# Patient Record
Sex: Male | Born: 1990 | Race: White | Hispanic: Yes | Marital: Single | Smoking: Current every day smoker
Health system: Southern US, Community
[De-identification: ages and names within clinical notes are randomized; demographics above are authoritative.]

## PROBLEM LIST (undated history)

## (undated) DIAGNOSIS — F989 Unspecified behavioral and emotional disorders with onset usually occurring in childhood and adolescence: Secondary | ICD-10-CM

## (undated) DIAGNOSIS — F419 Anxiety disorder, unspecified: Secondary | ICD-10-CM

---
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
## 2009-04-30 ENCOUNTER — Emergency Department (HOSPITAL_COMMUNITY): Admission: EM | Admit: 2009-04-30 | Discharge: 2009-05-01 | Payer: Self-pay | Admitting: Emergency Medicine

## 2010-04-27 ENCOUNTER — Emergency Department (HOSPITAL_COMMUNITY): Admission: EM | Admit: 2010-04-27 | Discharge: 2010-04-27 | Payer: Self-pay | Admitting: Emergency Medicine

## 2010-09-29 ENCOUNTER — Emergency Department (HOSPITAL_COMMUNITY)
Admission: EM | Admit: 2010-09-29 | Discharge: 2010-09-29 | Payer: Self-pay | Source: Home / Self Care | Admitting: Family Medicine

## 2010-11-13 LAB — RPR: RPR Ser Ql: NONREACTIVE

## 2010-12-04 LAB — DIFFERENTIAL
Basophils Absolute: 0.1 10*3/uL (ref 0.0–0.1)
Eosinophils Absolute: 0.3 10*3/uL (ref 0.0–0.7)
Lymphocytes Relative: 25 % (ref 12–46)
Monocytes Absolute: 0.8 10*3/uL (ref 0.1–1.0)
Neutro Abs: 5.6 10*3/uL (ref 1.7–7.7)

## 2010-12-04 LAB — COMPREHENSIVE METABOLIC PANEL
ALT: 19 U/L (ref 0–53)
AST: 22 U/L (ref 0–37)
Albumin: 4.2 g/dL (ref 3.5–5.2)
BUN: 12 mg/dL (ref 6–23)
CO2: 24 mEq/L (ref 19–32)
Calcium: 8.9 mg/dL (ref 8.4–10.5)
GFR calc Af Amer: >60 mL/min (ref >60)
Glucose, Bld: 99 mg/dL (ref 70–99)
Sodium: 140 mEq/L (ref 135–145)

## 2010-12-04 LAB — RAPID URINE DRUG SCREEN, HOSP PERFORMED
Amphetamines: NOT DETECTED
Barbiturates: NOT DETECTED
Benzodiazepines: NOT DETECTED
Cocaine: NOT DETECTED
Opiates: NOT DETECTED

## 2010-12-04 LAB — URINALYSIS, ROUTINE W REFLEX MICROSCOPIC
Bilirubin Urine: NEGATIVE
Glucose, UA: NEGATIVE mg/dL
Hgb urine dipstick: NEGATIVE
Protein, ur: NEGATIVE mg/dL

## 2010-12-04 LAB — CBC
Hemoglobin: 15.9 g/dL (ref 13.0–17.0)
RBC: 4.64 MIL/uL (ref 4.22–5.81)
WBC: 9.1 10*3/uL (ref 4.0–10.5)

## 2011-02-09 ENCOUNTER — Inpatient Hospital Stay (INDEPENDENT_AMBULATORY_CARE_PROVIDER_SITE_OTHER)
Admission: RE | Admit: 2011-02-09 | Discharge: 2011-02-09 | Disposition: A | Discharge status: Home / Self Care | Payer: Managed Care, Other (non HMO) | Source: Ambulatory Visit | Attending: Emergency Medicine | Admitting: Emergency Medicine

## 2011-02-09 DIAGNOSIS — K5289 Other specified noninfective gastroenteritis and colitis: Secondary | ICD-10-CM

## 2011-11-11 ENCOUNTER — Emergency Department (HOSPITAL_BASED_OUTPATIENT_CLINIC_OR_DEPARTMENT_OTHER)
Admission: EM | Admit: 2011-11-11 | Discharge: 2011-11-11 | Disposition: A | Discharge status: Home / Self Care | Payer: Self-pay | Attending: Emergency Medicine | Admitting: Emergency Medicine

## 2011-11-11 ENCOUNTER — Encounter (HOSPITAL_BASED_OUTPATIENT_CLINIC_OR_DEPARTMENT_OTHER): Payer: Self-pay | Admitting: *Deleted

## 2011-11-11 DIAGNOSIS — L410 Pityriasis lichenoides et varioliformis acuta: Secondary | ICD-10-CM

## 2011-11-11 DIAGNOSIS — Z87891 Personal history of nicotine dependence: Secondary | ICD-10-CM | POA: Insufficient documentation

## 2011-11-11 DIAGNOSIS — L419 Parapsoriasis, unspecified: Secondary | ICD-10-CM | POA: Insufficient documentation

## 2011-11-11 MED ORDER — PREDNISONE 50 MG PO TABS
60.0000 mg | ORAL_TABLET | Freq: Once | ORAL | Status: AC
  Administered 2011-11-11: 60 mg via ORAL
  Filled 2011-11-11: qty 1

## 2011-11-11 MED ORDER — PREDNISONE 10 MG PO TABS: 20.0000 mg | ORAL_TABLET | Freq: Every day | ORAL | Status: DC

## 2011-11-11 NOTE — Discharge Instructions (Signed)
A trial of prednisone take as directed. Followup for new or worse symptoms or followup with [facility] dermatology clinic if not improved. Also consider use ring cream to dry areas.

## 2011-11-11 NOTE — ED Notes (Signed)
Pt. Reports no new detergent and no resp, trouble.  Pt. Reports no meds and no allergies.  Pt. Reports rash on arms bilat and on legs bilat.

## 2011-11-11 NOTE — ED Notes (Signed)
Pt. Reports the rash started in Feb and has gradually gotten worse.  Pt. Reports it burns and itches,

## 2011-11-11 NOTE — ED Provider Notes (Signed)
History     CSN: [id number]  Arrival date & time 11/11/11  1505   First MD Initiated Contact with Patient 11/11/11 1513      Chief Complaint  Patient presents with  . Rash    noted red rash flat and spreading on the arms upper bilat. and on the thighs upper bilat.    (Consider location/radiation/quality/duration/timing/severity/associated sxs/prior treatment) Patient is a 21 y.o. male presenting with rash. The history is provided by the patient.  Rash  This is a new problem. The current episode started more than 1 week ago (Started about 2-3 weeks ago ). The problem has been gradually worsening. The problem is associated with nothing. There has been no fever. The rash is present on the back, left arm, right arm and right upper leg (Left upper leg). The pain is at a severity of 1/10. The pain is mild. The pain has been intermittent since onset. Associated symptoms include itching and pain. Pertinent negatives include no blisters and no weeping. Associated symptoms comments: Slight burning no severe pain. He has tried nothing for the symptoms.    History reviewed. No pertinent past medical history.  History reviewed. No pertinent past surgical history.  No family history on file.  History  Substance Use Topics  . Smoking status: Former Smoker -- 0.5 packs/day  . Smokeless tobacco: Not on file  . Alcohol Use: Yes      Review of Systems  Constitutional: Negative for fever and chills.  HENT: Negative for congestion, sore throat, neck pain and neck stiffness.   Respiratory: Negative for cough and shortness of breath.   Cardiovascular: Negative for chest pain, palpitations and leg swelling.  Gastrointestinal: Negative for nausea, vomiting, abdominal pain and diarrhea.  Genitourinary: Negative for dysuria.  Musculoskeletal: Negative for back pain.  Skin: Positive for itching. Negative for rash.  Neurological: Negative for headaches.  Hematological: Negative for adenopathy.     Allergies  Review of patient's allergies indicates no known allergies.  Home Medications   Current Outpatient Rx  Name Route Sig Dispense Refill  . PREDNISONE 10 MG PO TABS Oral Take 2 tablets (20 mg total) by mouth daily. 10 tablet 0    BP 135/76  Pulse 100  Temp(Src) 97.9 F (36.6 C) (Oral)  Resp 20  Ht 5\' 6"  (1.676 m)  Wt 190 lb (86.183 kg)  BMI 30.67 kg/m2  SpO2 100%  Physical Exam  Nursing note and vitals reviewed. Constitutional: He is oriented to person, place, and time. He appears well-developed and well-nourished. No distress.  HENT:  Head: Normocephalic and atraumatic.  Mouth/Throat: Oropharynx is clear and moist.  Eyes: Conjunctivae and EOM are normal. Pupils are equal, round, and reactive to light.  Neck: Normal range of motion. Neck supple.  Cardiovascular: Normal rate, regular rhythm, normal heart sounds and intact distal pulses.   No murmur heard. Pulmonary/Chest: Effort normal and breath sounds normal. No respiratory distress. He has no wheezes. He has no rales. He exhibits no tenderness.  Abdominal: Soft. Bowel sounds are normal. There is no tenderness.  Musculoskeletal: Normal range of motion. He exhibits no edema and no tenderness.  Lymphadenopathy:    He has no cervical adenopathy.  Neurological: He is alert and oriented to person, place, and time. No cranial nerve deficit. He exhibits normal muscle tone. Coordination normal.  Skin: Skin is warm. Rash noted. He is not diaphoretic. There is erythema.       Extensive erythema macules with dry skin on top some with  red borders and slight clearing not consistent with hives not truly consistent with a fungal infection most extensive bilateral upper arms and across the back some itching does blanch no weeping no hives no blisters    ED Course  Procedures (including critical care time)  Labs Reviewed - No data to display No results found.   1. Pityriasis lichenoides       MDM   Suspect rash may  be pityriasis rosy associated or possibly fungal but no clear or scaly border and all lesions. Will initially try a course of steroids if not helpful patient can give a trial of Lotrimin AF cream. No systemic symptoms. Patient currently does not have any dermatology followup did give mention of followup possibility with dermatology clinic at [facility]. As noted above patient is nontoxic in no acute distress       Shelda Jakes, MD 11/11/11 1626

## 2013-05-29 ENCOUNTER — Emergency Department (HOSPITAL_COMMUNITY)
Admission: EM | Admit: 2013-05-29 | Discharge: 2013-05-29 | Disposition: A | Discharge status: Home / Self Care | Payer: Managed Care, Other (non HMO) | Attending: Emergency Medicine | Admitting: Emergency Medicine

## 2013-05-29 ENCOUNTER — Other Ambulatory Visit: Payer: Self-pay

## 2013-05-29 ENCOUNTER — Encounter (HOSPITAL_COMMUNITY): Payer: Self-pay | Admitting: Adult Health

## 2013-05-29 ENCOUNTER — Emergency Department (HOSPITAL_COMMUNITY): Payer: Managed Care, Other (non HMO)

## 2013-05-29 DIAGNOSIS — F17200 Nicotine dependence, unspecified, uncomplicated: Secondary | ICD-10-CM | POA: Insufficient documentation

## 2013-05-29 DIAGNOSIS — R0789 Other chest pain: Secondary | ICD-10-CM | POA: Insufficient documentation

## 2013-05-29 DIAGNOSIS — J069 Acute upper respiratory infection, unspecified: Secondary | ICD-10-CM | POA: Insufficient documentation

## 2013-05-29 DIAGNOSIS — J9801 Acute bronchospasm: Secondary | ICD-10-CM | POA: Insufficient documentation

## 2013-05-29 LAB — CBC
Hemoglobin: 14.8 g/dL (ref 13.0–17.0)
MCHC: 35.7 g/dL (ref 30.0–36.0)
MCV: 93 fL (ref 78.0–100.0)
Platelets: 216 10*3/uL (ref 150–400)
WBC: 10.7 10*3/uL — ABNORMAL HIGH (ref 4.0–10.5)

## 2013-05-29 LAB — BASIC METABOLIC PANEL
BUN: 15 mg/dL (ref 6–23)
Calcium: 8.8 mg/dL (ref 8.4–10.5)
Creatinine, Ser: 1.17 mg/dL (ref 0.50–1.35)
GFR calc Af Amer: >90 mL/min (ref >90)
GFR calc non Af Amer: 86 mL/min — ABNORMAL LOW (ref >90)

## 2013-05-29 MED ORDER — ALBUTEROL SULFATE (5 MG/ML) 0.5% IN NEBU
INHALATION_SOLUTION | RESPIRATORY_TRACT | Status: AC
  Filled 2013-05-29: qty 1

## 2013-05-29 MED ORDER — DM-GUAIFENESIN ER 30-600 MG PO TB12
1.0000 | ORAL_TABLET | Freq: Once | ORAL | Status: AC
  Administered 2013-05-29: 1 via ORAL
  Filled 2013-05-29: qty 1

## 2013-05-29 MED ORDER — DM-GUAIFENESIN ER 30-600 MG PO TB12: 1.0000 | ORAL_TABLET | Freq: Two times a day (BID) | ORAL | Status: DC

## 2013-05-29 MED ORDER — IPRATROPIUM BROMIDE 0.02 % IN SOLN
RESPIRATORY_TRACT | Status: AC
  Filled 2013-05-29: qty 2.5

## 2013-05-29 MED ORDER — ALBUTEROL SULFATE HFA 108 (90 BASE) MCG/ACT IN AERS
1.0000 | INHALATION_SPRAY | RESPIRATORY_TRACT | Status: DC | PRN
  Administered 2013-05-29: 2 via RESPIRATORY_TRACT
  Filled 2013-05-29 (×2): qty 6.7

## 2013-05-29 MED ORDER — ALBUTEROL SULFATE (5 MG/ML) 0.5% IN NEBU
5.0000 mg | INHALATION_SOLUTION | Freq: Once | RESPIRATORY_TRACT | Status: AC
  Administered 2013-05-29: 5 mg via RESPIRATORY_TRACT

## 2013-05-29 MED ORDER — IBUPROFEN 800 MG PO TABS
800.0000 mg | ORAL_TABLET | Freq: Once | ORAL | Status: AC
  Administered 2013-05-29: 800 mg via ORAL

## 2013-05-29 MED ORDER — IPRATROPIUM BROMIDE 0.02 % IN SOLN
0.5000 mg | Freq: Once | RESPIRATORY_TRACT | Status: AC
  Administered 2013-05-29: 0.5 mg via RESPIRATORY_TRACT

## 2013-05-29 MED ORDER — NAPROXEN 500 MG PO TABS: 500.0000 mg | ORAL_TABLET | Freq: Two times a day (BID) | ORAL | Status: DC

## 2013-05-29 MED ORDER — IBUPROFEN 800 MG PO TABS
800.0000 mg | ORAL_TABLET | Freq: Once | ORAL | Status: AC
  Administered 2013-05-29: 800 mg via ORAL
  Filled 2013-05-29: qty 1

## 2013-05-29 NOTE — ED Provider Notes (Signed)
CSN: [id number]     Arrival date & time 05/29/13  0002 History   First MD Initiated Contact with Patient 05/29/13 [phone]     Chief Complaint  Patient presents with  . Chest Pain   (Consider location/radiation/quality/duration/timing/severity/associated sxs/prior Treatment) HPI 22 yo male presents to the ER from home with complaint of 4 days of central chest pain and left lateral chest pain.  Pain is sharp, crampy in nature, worse with movement.  He has had some cough and SOB, worse today.  Wheezing on initial presentation at triage, reports that started yesterday.  He has had some upper respiratory congestion, runny nose, body aches, fatigue.  History reviewed. No pertinent past medical history. History reviewed. No pertinent past surgical history. History reviewed. No pertinent family history. History  Substance Use Topics  . Smoking status: Current Every Day Smoker -- 0.50 packs/day    Types: Cigarettes  . Smokeless tobacco: Not on file  . Alcohol Use: Yes    Review of Systems  All other systems reviewed and are negative.    Allergies  Review of patient's allergies indicates no known allergies.  Home Medications   Current Outpatient Rx  Name  Route  Sig  Dispense  Refill  . dextromethorphan-guaiFENesin (MUCINEX DM) 30-600 MG per 12 hr tablet   Oral   Take 1 tablet by mouth 2 (two) times daily.   30 tablet   0   . naproxen (NAPROSYN) 500 MG tablet   Oral   Take 1 tablet (500 mg total) by mouth 2 (two) times daily.   30 tablet   0    BP 115/75  Pulse 73  Temp(Src) 98.4 F (36.9 C) (Oral)  Resp 17  SpO2 96% Physical Exam  Nursing note and vitals reviewed. Constitutional: He is oriented to person, place, and time. He appears well-developed and well-nourished.  HENT:  Head: Normocephalic and atraumatic.  Right Ear: External ear normal.  Left Ear: External ear normal.  Nose: Nose normal.  Mouth/Throat: Oropharynx is clear and moist.  Eyes: Conjunctivae and EOM  are normal. Pupils are equal, round, and reactive to light.  Neck: Normal range of motion. Neck supple. No JVD present. No tracheal deviation present. No thyromegaly present.  Cardiovascular: Normal rate, regular rhythm, normal heart sounds and intact distal pulses.  Exam reveals no gallop and no friction rub.   No murmur heard. Pulmonary/Chest: Effort normal and breath sounds normal. No stridor. No respiratory distress. He has no wheezes. He has no rales. He exhibits no tenderness.  Abdominal: Soft. Bowel sounds are normal. He exhibits no distension and no mass. There is no tenderness. There is no rebound and no guarding.  Musculoskeletal: Normal range of motion. He exhibits no edema and no tenderness.  Lymphadenopathy:    He has no cervical adenopathy.  Neurological: He is alert and oriented to person, place, and time. He exhibits normal muscle tone. Coordination normal.  Skin: Skin is warm and dry. No rash noted. No erythema. No pallor.  Psychiatric: He has a normal mood and affect. His behavior is normal. Judgment and thought content normal.    ED Course  Procedures (including critical care time) Labs Review Labs Reviewed  CBC - Abnormal; Notable for the following:    WBC 10.7 (*)    All other components within normal limits  BASIC METABOLIC PANEL - Abnormal; Notable for the following:    Glucose, Bld 108 (*)    GFR calc non Af Amer 86 (*)  All other components within normal limits   Imaging Review Dg Chest 2 View (if Patient Has Fever And/or Copd)  05/29/2013   CLINICAL DATA:  Chest pain, cough.  EXAM: CHEST  2 VIEW  COMPARISON:  None.  FINDINGS: The heart size and mediastinal contours are within normal limits. Both lungs are clear. The visualized skeletal structures are unremarkable.  IMPRESSION: No active cardiopulmonary disease.   Electronically Signed   By: Charlett Nose M.D.   On: 05/29/2013 00:58    MDM   1. Atypical chest pain   2. Bronchospasm   3. Upper respiratory  infection    22 yo male with 4 days of chest pain, now with URI type symptoms.  Wheezing has resolved after neb tx.  Suspect viral syndrome.  Will treat symptomatically.    Olivia Mackie, MD 05/29/13 [phone]

## 2013-05-29 NOTE — ED Notes (Signed)
Presents with 4 days of sternal chest pain described as cramping, with nonproductive cough, pain associated withSOB, pt has bilateral inspiratory and expiratory wheezes. Nothing makes pain worse, lying flat makes pain better.

## 2015-04-12 ENCOUNTER — Encounter: Payer: Self-pay | Admitting: Emergency Medicine

## 2015-04-12 ENCOUNTER — Emergency Department
Admission: EM | Admit: 2015-04-12 | Discharge: 2015-04-13 | Disposition: A | Discharge status: Home / Self Care | Payer: Managed Care, Other (non HMO) | Attending: Emergency Medicine | Admitting: Emergency Medicine

## 2015-04-12 DIAGNOSIS — W19XXXA Unspecified fall, initial encounter: Secondary | ICD-10-CM

## 2015-04-12 DIAGNOSIS — Z79899 Other long term (current) drug therapy: Secondary | ICD-10-CM | POA: Insufficient documentation

## 2015-04-12 DIAGNOSIS — Y998 Other external cause status: Secondary | ICD-10-CM | POA: Insufficient documentation

## 2015-04-12 DIAGNOSIS — Y9389 Activity, other specified: Secondary | ICD-10-CM | POA: Insufficient documentation

## 2015-04-12 DIAGNOSIS — Y9289 Other specified places as the place of occurrence of the external cause: Secondary | ICD-10-CM | POA: Insufficient documentation

## 2015-04-12 DIAGNOSIS — S99922A Unspecified injury of left foot, initial encounter: Secondary | ICD-10-CM | POA: Insufficient documentation

## 2015-04-12 DIAGNOSIS — S8012XA Contusion of left lower leg, initial encounter: Secondary | ICD-10-CM | POA: Insufficient documentation

## 2015-04-12 DIAGNOSIS — W010XXA Fall on same level from slipping, tripping and stumbling without subsequent striking against object, initial encounter: Secondary | ICD-10-CM | POA: Insufficient documentation

## 2015-04-12 DIAGNOSIS — Z720 Tobacco use: Secondary | ICD-10-CM | POA: Insufficient documentation

## 2015-04-12 DIAGNOSIS — S79922A Unspecified injury of left thigh, initial encounter: Secondary | ICD-10-CM | POA: Insufficient documentation

## 2015-04-12 DIAGNOSIS — Z791 Long term (current) use of non-steroidal anti-inflammatories (NSAID): Secondary | ICD-10-CM | POA: Insufficient documentation

## 2015-04-12 DIAGNOSIS — M79672 Pain in left foot: Secondary | ICD-10-CM

## 2015-04-12 NOTE — ED Notes (Addendum)
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

## 2015-04-13 ENCOUNTER — Emergency Department: Payer: Managed Care, Other (non HMO)

## 2015-04-13 MED ORDER — HYDROMORPHONE HCL 1 MG/ML IJ SOLN
INTRAMUSCULAR | Status: AC
  Administered 2015-04-13: 1 mg via INTRAVENOUS
  Filled 2015-04-13: qty 1

## 2015-04-13 MED ORDER — ONDANSETRON HCL 4 MG/2ML IJ SOLN
4.0000 mg | Freq: Once | INTRAMUSCULAR | Status: AC
  Administered 2015-04-13: 4 mg via INTRAVENOUS

## 2015-04-13 MED ORDER — HYDROMORPHONE HCL 1 MG/ML IJ SOLN
1.0000 mg | Freq: Once | INTRAMUSCULAR | Status: AC
  Administered 2015-04-13: 1 mg via INTRAVENOUS
  Filled 2015-04-13: qty 1

## 2015-04-13 MED ORDER — TRAMADOL HCL 50 MG PO TABS: 100.0000 mg | ORAL_TABLET | Freq: Four times a day (QID) | ORAL | Status: AC | PRN

## 2015-04-13 MED ORDER — HYDROMORPHONE HCL 1 MG/ML IJ SOLN
1.0000 mg | Freq: Once | INTRAMUSCULAR | Status: AC
  Administered 2015-04-13: 1 mg via INTRAVENOUS

## 2015-04-13 MED ORDER — ONDANSETRON HCL 4 MG/2ML IJ SOLN
INTRAMUSCULAR | Status: AC
  Administered 2015-04-13: 4 mg via INTRAVENOUS
  Filled 2015-04-13: qty 2

## 2015-04-13 NOTE — Discharge Instructions (Signed)
X-rays of your pelvis, thigh, lower leg, and foot were negative-no broken bones. Take ibuprofen, 800 mg, 3 times a day. Take tramadol if needed for additional pain control. Return to the emergency department if you have worsening pain or other urgent concerns.  Contusion A contusion is a deep bruise. Contusions are the result of an injury that caused bleeding under the skin. The contusion may turn blue, purple, or yellow. Minor injuries will give you a painless contusion, but more severe contusions may stay painful and swollen for a few weeks.  CAUSES  A contusion is usually caused by a blow, trauma, or direct force to an area of the body. SYMPTOMS   Swelling and redness of the injured area.  Bruising of the injured area.  Tenderness and soreness of the injured area.  Pain. DIAGNOSIS  The diagnosis can be made by taking a history and physical exam. An X-ray, CT scan, or MRI may be needed to determine if there were any associated injuries, such as fractures. TREATMENT  Specific treatment will depend on what area of the body was injured. In general, the best treatment for a contusion is resting, icing, elevating, and applying cold compresses to the injured area. Over-the-counter medicines may also be recommended for pain control. Ask your caregiver what the best treatment is for your contusion. HOME CARE INSTRUCTIONS   Put ice on the injured area.  Put ice in a plastic bag.  Place a towel between your skin and the bag.  Leave the ice on for 15-20 minutes, 3-4 times a day, or as directed by your health care provider.  Only take over-the-counter or prescription medicines for pain, discomfort, or fever as directed by your caregiver. Your caregiver may recommend avoiding anti-inflammatory medicines (aspirin, ibuprofen, and naproxen) for 48 hours because these medicines may increase bruising.  Rest the injured area.  If possible, elevate the injured area to reduce swelling. SEEK  IMMEDIATE MEDICAL CARE IF:   You have increased bruising or swelling.  You have pain that is getting worse.  Your swelling or pain is not relieved with medicines. MAKE SURE YOU:   Understand these instructions.  Will watch your condition.  Will get help right away if you are not doing well or get worse. Document Released: 05/26/2005 Document Revised: 08/21/2013 Document Reviewed: 06/21/2011 [facility] Patient Information 2015 [place], [place]. This information is not intended to replace advice given to you by your health care provider. Make sure you discuss any questions you have with your health care provider.

## 2015-04-13 NOTE — ED Provider Notes (Signed)
[facility] Emergency Department Provider Note  ____________________________________________  Time seen: 0005  I have reviewed the triage vital signs and the nursing notes.   HISTORY  Chief Complaint Leg Pain; Ankle Pain; Foot Pain; and Fall     HPI Mason Kemp is a 24 y.o. male who is standing on painters stilts working. He had doubled up the stilts to get up particularly high. He reports he was approximately 10 feet off the ground on the stilts when he stepped on a piece of plastic and slipped and fell at approximately 4:30 or 5:00 on Saturday. He fell landing on his left leg. The stilts strap on and connects to the lower leg. The patient came home from work but began having escalating pain this evening. He is now complaining of severe pain in his left knee. He appears extremely uncomfortable.  He is also complaining of pain further down the leg and in the left foot. He reports that it hurts to move the foot.  He denies hitting his head. He denies any neck pain.   History reviewed. No pertinent past medical history.  There are no active problems to display for this patient.   History reviewed. No pertinent past surgical history.  Current Outpatient Rx  Name  Route  Sig  Dispense  Refill  . dextromethorphan-guaiFENesin (MUCINEX DM) 30-600 MG per 12 hr tablet   Oral   Take 1 tablet by mouth 2 (two) times daily.   30 tablet   0   . naproxen (NAPROSYN) 500 MG tablet   Oral   Take 1 tablet (500 mg total) by mouth 2 (two) times daily.   30 tablet   0   . traMADol (ULTRAM) 50 MG tablet   Oral   Take 2 tablets (100 mg total) by mouth every 6 (six) hours as needed.   20 tablet   0     Allergies Review of patient's allergies indicates no known allergies.  History reviewed. No pertinent family history.  Social History Social History  Substance Use Topics  . Smoking status: Current Every Day Smoker -- 0.50 packs/day    Types:  Cigarettes  . Smokeless tobacco: None  . Alcohol Use: Yes    Review of Systems  Constitutional: Negative for fever. Cardiovascular: Negative for chest pain. Respiratory: Negative for shortness of breath. Gastrointestinal: Negative for abdominal pain, vomiting and diarrhea. Genitourinary: Negative for dysuria. Musculoskeletal: Severe pain in the left knee. See history of present illness. Skin: Negative for rash. Neurological: Negative for headaches   10-point ROS otherwise negative.  ____________________________________________   PHYSICAL EXAM:  VITAL SIGNS: ED Triage Vitals  Enc Vitals Group     BP 04/12/15 2351 122/99 mmHg     Pulse Rate 04/12/15 2351 102     Resp 04/12/15 2351 24     Temp 04/12/15 2351 98.3 F (36.8 C)     Temp Source 04/12/15 2351 Oral     SpO2 04/12/15 2351 98 %     Weight 04/12/15 2351 230 lb (104.327 kg)     Height 04/12/15 2351  (1.676 m)     Head Cir --      Peak Flow --      Pain Score 04/12/15 2352 10     Pain Loc --      Pain Edu? --      Excl. in GC? --     Constitutional: Alert. In significant distress. ENT   Head: Normocephalic and atraumatic.  Nose: No congestion/rhinnorhea. Cervical: Nontender. Patient moving spontaneously without difficulty. Cardiovascular: Normal rate, regular rhythm, no murmur noted Respiratory:  Normal respiratory effort, no tachypnea.    Breath sounds are clear and equal bilaterally.  Gastrointestinal: Soft and nontender. No distention.  Back: No muscle spasm, no tenderness, no CVA tenderness. Musculoskeletal: No deformity noted. Significant pain and the distal left femur and proximal lower leg and the left.  Neurologic:  Normal speech and language. No gross focal neurologic deficits are appreciated.  Skin:  Skin is warm, dry. No rash noted. No lacerations or skin break. Psychiatric: Alert, communicative, but in pain. Behavior is appropriate for circumstance.   ____________________________________________  RADIOLOGY  Pelvis: Negative Left femur: Negative Left tib-fib: Negative Left foot: Negative  ____________________________________________   INITIAL IMPRESSION / ASSESSMENT AND PLAN / ED COURSE  Pertinent labs & imaging results that were available during my care of the patient were reviewed by me and considered in my medical decision making (see chart for details).  Due to extreme distress, patient was treated with 1 mg of Dilaudid. X-rays have been ordered.  ----------------------------------------- 1:31 AM on 04/13/2015 -----------------------------------------  Patient did require distal Dilaudid for pain control. He received one more milligram. After that his pain was controlled and he fell asleep.  His x-rays were negative.  He is now awake and more comfortable.  I have advised him to take ibuprofen. I'll writing a prescription for tramadol as well. I counseled him to follow up with orthopedics if he has ongoing pain. He's been advised to return to the emergency department if his pain worsens or if he has other urgent concerns.  ____________________________________________   FINAL CLINICAL IMPRESSION(S) / ED DIAGNOSES  Final diagnoses:  Contusion of left leg, initial encounter  Pain in left foot  Fall, initial encounter      Darien Ramus, MD 04/13/15 0131

## 2017-04-10 ENCOUNTER — Encounter (HOSPITAL_COMMUNITY): Payer: Self-pay | Admitting: Emergency Medicine

## 2017-04-10 ENCOUNTER — Emergency Department (HOSPITAL_COMMUNITY)
Admission: EM | Admit: 2017-04-10 | Discharge: 2017-04-10 | Disposition: A | Discharge status: Home / Self Care | Payer: Managed Care, Other (non HMO) | Attending: Emergency Medicine | Admitting: Emergency Medicine

## 2017-04-10 ENCOUNTER — Emergency Department (HOSPITAL_COMMUNITY): Payer: Managed Care, Other (non HMO)

## 2017-04-10 DIAGNOSIS — R42 Dizziness and giddiness: Secondary | ICD-10-CM | POA: Insufficient documentation

## 2017-04-10 DIAGNOSIS — F911 Conduct disorder, childhood-onset type: Secondary | ICD-10-CM | POA: Insufficient documentation

## 2017-04-10 DIAGNOSIS — R0789 Other chest pain: Secondary | ICD-10-CM

## 2017-04-10 DIAGNOSIS — F17210 Nicotine dependence, cigarettes, uncomplicated: Secondary | ICD-10-CM | POA: Insufficient documentation

## 2017-04-10 DIAGNOSIS — R110 Nausea: Secondary | ICD-10-CM | POA: Insufficient documentation

## 2017-04-10 DIAGNOSIS — F419 Anxiety disorder, unspecified: Secondary | ICD-10-CM | POA: Insufficient documentation

## 2017-04-10 DIAGNOSIS — Z79899 Other long term (current) drug therapy: Secondary | ICD-10-CM | POA: Insufficient documentation

## 2017-04-10 HISTORY — DX: Unspecified behavioral and emotional disorders with onset usually occurring in childhood and adolescence: F98.9

## 2017-04-10 HISTORY — DX: Anxiety disorder, unspecified: F41.9

## 2017-04-10 LAB — COMPREHENSIVE METABOLIC PANEL
ALBUMIN: 4.4 g/dL (ref 3.5–5.0)
ALK PHOS: 75 U/L (ref 38–126)
ALT: 34 U/L (ref 17–63)
ANION GAP: 14 (ref 5–15)
AST: 30 U/L (ref 15–41)
BUN: 14 mg/dL (ref 6–20)
CALCIUM: 9.4 mg/dL (ref 8.9–10.3)
CO2: 20 mmol/L — AB (ref 22–32)
CREATININE: 1.26 mg/dL — AB (ref 0.61–1.24)
Chloride: 108 mmol/L (ref 101–111)
GFR calc Af Amer: >60 mL/min (ref >60)
GFR calc non Af Amer: >60 mL/min (ref >60)
Glucose, Bld: 91 mg/dL (ref 65–99)
Potassium: 3.8 mmol/L (ref 3.5–5.1)
SODIUM: 142 mmol/L (ref 135–145)
Total Bilirubin: 1.7 mg/dL — ABNORMAL HIGH (ref 0.3–1.2)
Total Protein: 7.8 g/dL (ref 6.5–8.1)

## 2017-04-10 LAB — CBC WITH DIFFERENTIAL/PLATELET
BASOS ABS: 0 10*3/uL (ref 0.0–0.1)
Basophils Relative: 0 %
EOS ABS: 0.2 10*3/uL (ref 0.0–0.7)
Eosinophils Relative: 1 %
HCT: 48 % (ref 39.0–52.0)
Hemoglobin: 17.1 g/dL — ABNORMAL HIGH (ref 13.0–17.0)
LYMPHS PCT: 10 %
Lymphs Abs: 1.7 10*3/uL (ref 0.7–4.0)
MCH: 33.3 pg (ref 26.0–34.0)
MCHC: 35.6 g/dL (ref 30.0–36.0)
MCV: 93.6 fL (ref 78.0–100.0)
Monocytes Absolute: 1.7 10*3/uL — ABNORMAL HIGH (ref 0.1–1.0)
Monocytes Relative: 10 %
NEUTROS PCT: 79 %
Neutro Abs: 13.8 10*3/uL — ABNORMAL HIGH (ref 1.7–7.7)
Platelets: 233 10*3/uL (ref 150–400)
RBC: 5.13 MIL/uL (ref 4.22–5.81)
RDW: 12.5 % (ref 11.5–15.5)
WBC: 17.4 10*3/uL — ABNORMAL HIGH (ref 4.0–10.5)

## 2017-04-10 LAB — I-STAT TROPONIN, ED
Troponin i, poc: 0 ng/mL (ref 0.00–0.08)
Troponin i, poc: 0 ng/mL (ref 0.00–0.08)

## 2017-04-10 LAB — LIPASE, BLOOD: Lipase: 20 U/L (ref 11–51)

## 2017-04-10 LAB — D-DIMER, QUANTITATIVE: D-Dimer, Quant: 0.28 ug/mL-FEU (ref 0.00–0.50)

## 2017-04-10 MED ORDER — IBUPROFEN 600 MG PO TABS: 600.0000 mg | ORAL_TABLET | Freq: Four times a day (QID) | ORAL | 0 refills | Status: AC | PRN

## 2017-04-10 MED ORDER — METHOCARBAMOL 500 MG PO TABS: 1000.0000 mg | ORAL_TABLET | Freq: Three times a day (TID) | ORAL | 0 refills | Status: AC | PRN

## 2017-04-10 MED ORDER — IBUPROFEN 400 MG PO TABS
600.0000 mg | ORAL_TABLET | Freq: Once | ORAL | Status: AC
  Administered 2017-04-10: 600 mg via ORAL
  Filled 2017-04-10: qty 1

## 2017-04-10 NOTE — ED Triage Notes (Signed)
[facility] with c/o chest pain and anxiety.  Pt has been in jail x3 days with a bond hearing today.  Pt was denied bond and began to get emotional and was placed in solitary. Pt then proceeded to hold his breath and scream and yell, then refusing to speak.  At that time pt began to complain of cp and abdominal pain.  VSS

## 2017-04-10 NOTE — ED Provider Notes (Signed)
[facility]-EMERGENCY DEPT Provider Note   CSN: [id number] Arrival date & time: 04/10/17  1444     History   Chief Complaint Chief Complaint  Patient presents with  . Chest Pain  . Abdominal Pain    HPI Mason Kemp is a 26 y.o. male.  HPI Patient presents with police escort. Complaining of left-sided chest pain workups with deep breathing. States he's had tingling to his fingers. Per EMS patient began complaining of these symptoms after having a bond hearing today which was denied. Describes the pain as sharp. Denies abdominal pain. No new lower extremity swelling or pain. When directly asked when the pain started patient states he is unable to remember. Past Medical History:  Diagnosis Date  . Anxiety   . Behavioral and emotional disorder with onset in childhood     There are no active problems to display for this patient.   History reviewed. No pertinent surgical history.     Home Medications    Prior to Admission medications   Medication Sig Start Date End Date Taking? Authorizing Provider  HydrOXYzine Pamoate (VISTARIL PO) Take by mouth.   Yes [provider]  UNKNOWN TO PATIENT Anxiety Med   Yes [provider]  ibuprofen (ADVIL,MOTRIN) 600 MG tablet Take 1 tablet (600 mg total) by mouth every 6 (six) hours as needed. 04/10/17   Loren RacerYelverton, Dai Mcadams, MD  methocarbamol (ROBAXIN) 500 MG tablet Take 2 tablets (1,000 mg total) by mouth every 8 (eight) hours as needed for muscle spasms. 04/10/17   Loren RacerYelverton, December Hedtke, MD    Family History History reviewed. No pertinent family history.  Social History Social History  Substance Use Topics  . Smoking status: Current Every Day Smoker    Packs/day: 0.50    Types: Cigarettes  . Smokeless tobacco: Not on file  . Alcohol use Yes     Allergies   Patient has no known allergies.   Review of Systems Review of Systems  Constitutional: Negative for chills and fever.  HENT: Negative for facial swelling  and trouble swallowing.   Eyes: Negative for visual disturbance.  Respiratory: Positive for shortness of breath. Negative for cough, chest tightness and wheezing.   Cardiovascular: Positive for chest pain. Negative for palpitations and leg swelling.  Gastrointestinal: Positive for nausea. Negative for abdominal pain, diarrhea and vomiting.  Musculoskeletal: Negative for back pain, myalgias, neck pain and neck stiffness.  Skin: Negative for rash and wound.  Neurological: Positive for dizziness, light-headedness and numbness. Negative for syncope, weakness and headaches.  Psychiatric/Behavioral: The patient is nervous/anxious.   All other systems reviewed and are negative.    Physical Exam Updated Vital Signs BP 126/73   Pulse 80   Temp 99.1 F (37.3 C) (Oral)   Resp 19   Ht 5\' 6"  (1.676 m)   Wt 90.7 kg (200 lb)   SpO2 98%   BMI 32.28 kg/m   Physical Exam  Constitutional: He is oriented to person, place, and time. He appears well-developed and well-nourished. No distress.  HENT:  Head: Normocephalic and atraumatic.  Mouth/Throat: Oropharynx is clear and moist. No oropharyngeal exudate.  Eyes: Pupils are equal, round, and reactive to light. EOM are normal.  Neck: Normal range of motion. Neck supple. No JVD present.  Cardiovascular: Normal rate and regular rhythm.  Exam reveals no gallop and no friction rub.   No murmur heard. Pulmonary/Chest: Effort normal and breath sounds normal. No respiratory distress. He has no wheezes. He has no rales. He exhibits tenderness.  Patient has left inferior chest tenderness to palpation. There is no crepitance or deformity.  Abdominal: Soft. Bowel sounds are normal. There is no tenderness. There is no rebound and no guarding.  Musculoskeletal: Normal range of motion. He exhibits no edema or tenderness.  No lower extremity swelling, asymmetry or tenderness. Distal pulses are 2+.  Neurological: He is alert and oriented to person, place, and time.    Moving all extremities without deficit. Sensation intact.  Skin: Skin is warm and dry. Capillary refill takes less than 2 seconds. No rash noted. He is not diaphoretic. No erythema.  Psychiatric: He has a normal mood and affect. His behavior is normal.  Nursing note and vitals reviewed.    ED Treatments / Results  Labs (all labs ordered are listed, but only abnormal results are displayed) Labs Reviewed  CBC WITH DIFFERENTIAL/PLATELET - Abnormal; Notable for the following:       Result Value   WBC 17.4 (*)    Hemoglobin 17.1 (*)    Neutro Abs 13.8 (*)    Monocytes Absolute 1.7 (*)    All other components within normal limits  COMPREHENSIVE METABOLIC PANEL - Abnormal; Notable for the following:    CO2 20 (*)    Creatinine, Ser 1.26 (*)    Total Bilirubin 1.7 (*)    All other components within normal limits  LIPASE, BLOOD  D-DIMER, QUANTITATIVE (NOT AT [facility])  I-STAT TROPONIN, ED  I-STAT TROPONIN, ED    EKG  EKG Interpretation  Date/Time:  Sunday April 10 2017 14:49:43 EDT Ventricular Rate:  82 PR Interval:    QRS Duration: 83 QT Interval:  362 QTC Calculation: 423 R Axis:   65 Text Interpretation:  Sinus rhythm No significant change since last tracing Confirmed by Raeford Razor [phone]) on 04/12/2017 6:24:37 AM       Radiology No results found.  Procedures Procedures (including critical care time)  Medications Ordered in ED Medications  ibuprofen (ADVIL,MOTRIN) tablet 600 mg (600 mg Oral Given 04/10/17 2113)     Initial Impression / Assessment and Plan / ED Course  I have reviewed the triage vital signs and the nursing notes.  Pertinent labs & imaging results that were available during my care of the patient were reviewed by me and considered in my medical decision making (see chart for details).     Troponin 2 is negative. Low suspicion for coronary artery disease. Pain is reproduced with palpation. We'll treat for muscle skeletal chest pain. There is  likely an element of anxiety associated with this. Return precautions given.  Final Clinical Impressions(s) / ED Diagnoses   Final diagnoses:  Left-sided chest wall pain  Anxiety    New Prescriptions Discharge Medication List as of 04/10/2017  8:38 PM    START taking these medications   Details  ibuprofen (ADVIL,MOTRIN) 600 MG tablet Take 1 tablet (600 mg total) by mouth every 6 (six) hours as needed., Starting Sun 04/10/2017, Print    methocarbamol (ROBAXIN) 500 MG tablet Take 2 tablets (1,000 mg total) by mouth every 8 (eight) hours as needed for muscle spasms., Starting Sun 04/10/2017, Print         Loren Racer, MD 04/15/17 [phone]

## 2017-07-14 IMAGING — CR DG FEMUR 2+V*L*
1 series · 4 of 4 positions shown · non-contrast
Comparison: None.

CLINICAL DATA: Status post fall off stilts while painting. Left leg
pain and inability to bear weight. Initial encounter.

EXAM:
LEFT FEMUR 2 VIEWS

[Series 1: dg femur min 2 views left · 0.14mm/px · 4 of 4 slices shown]
[im 1/4]
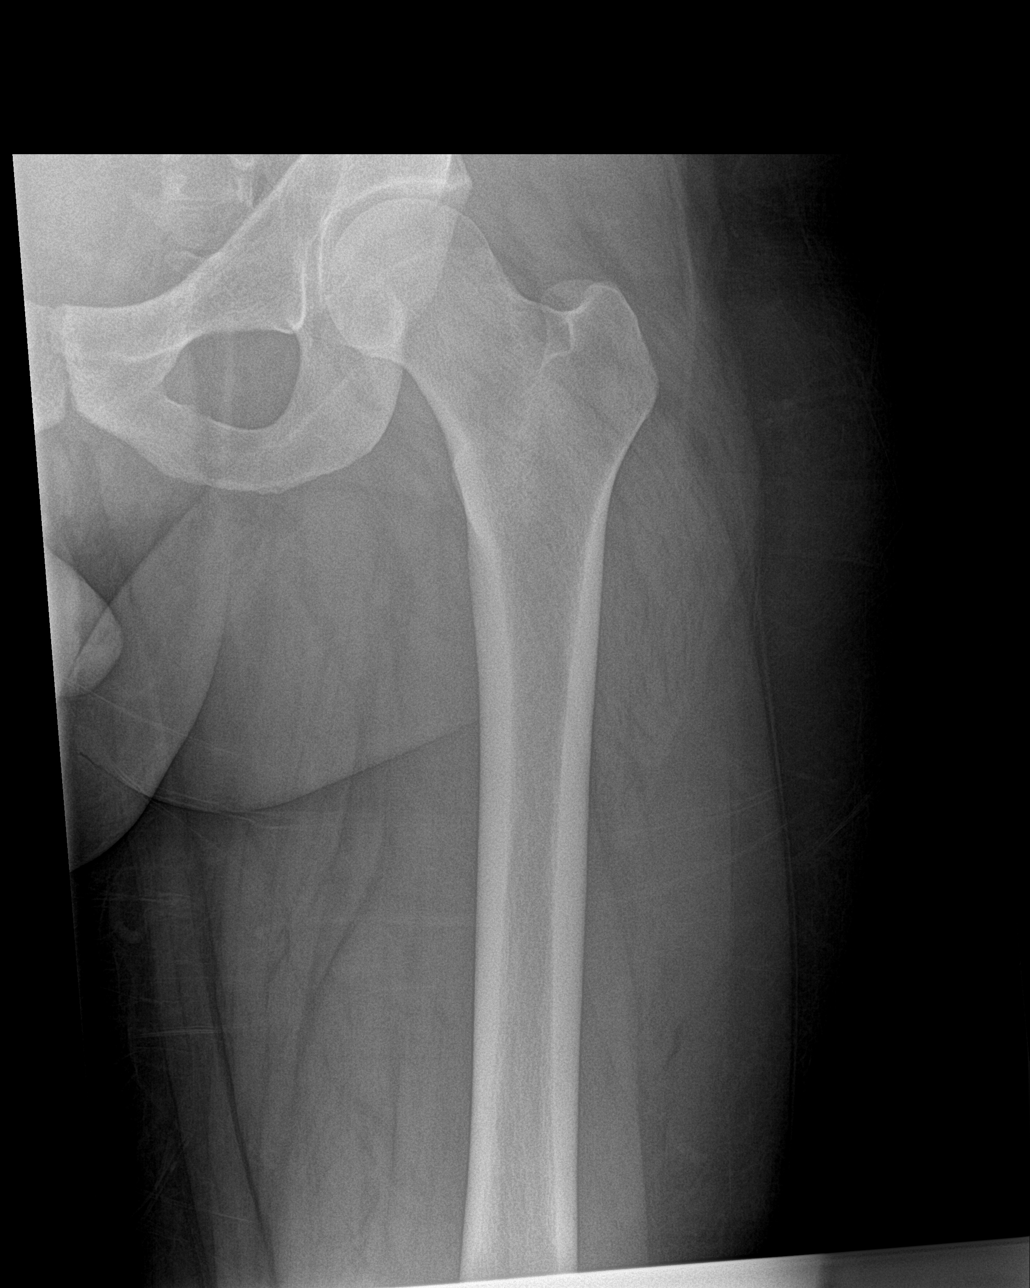
[im 2/4]
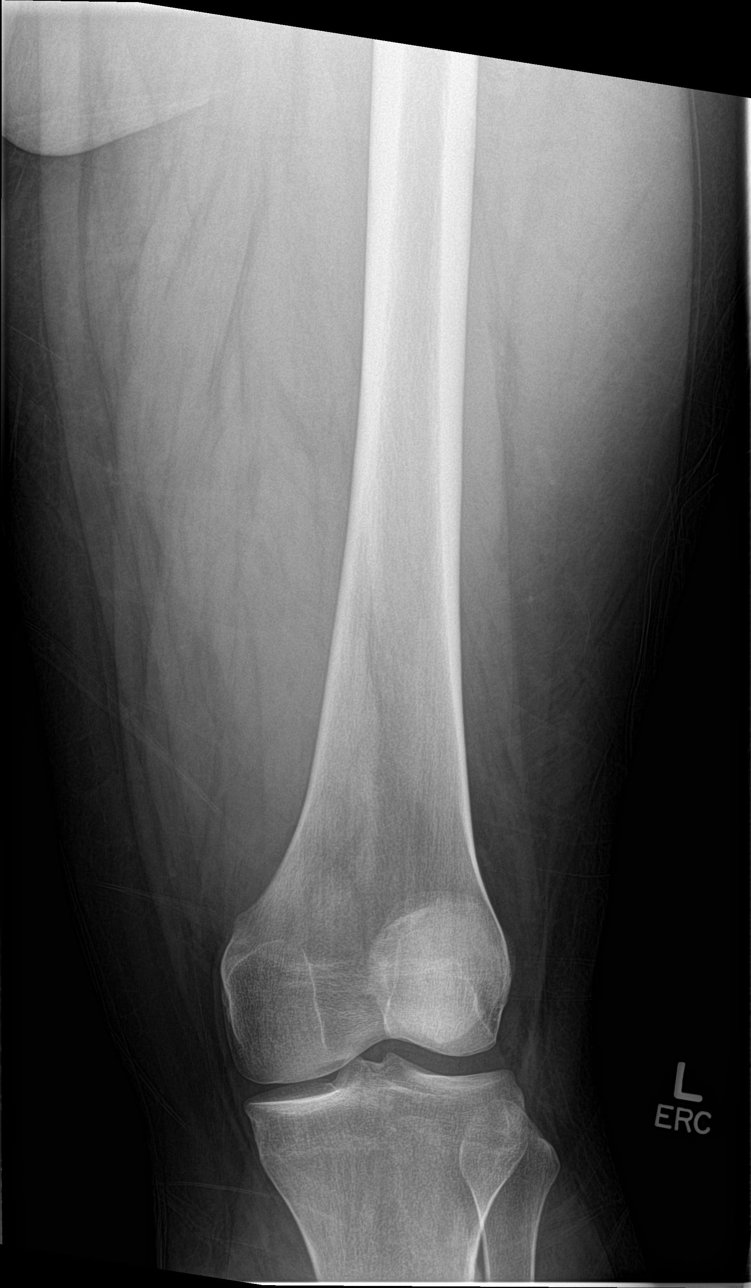
[im 3/4]
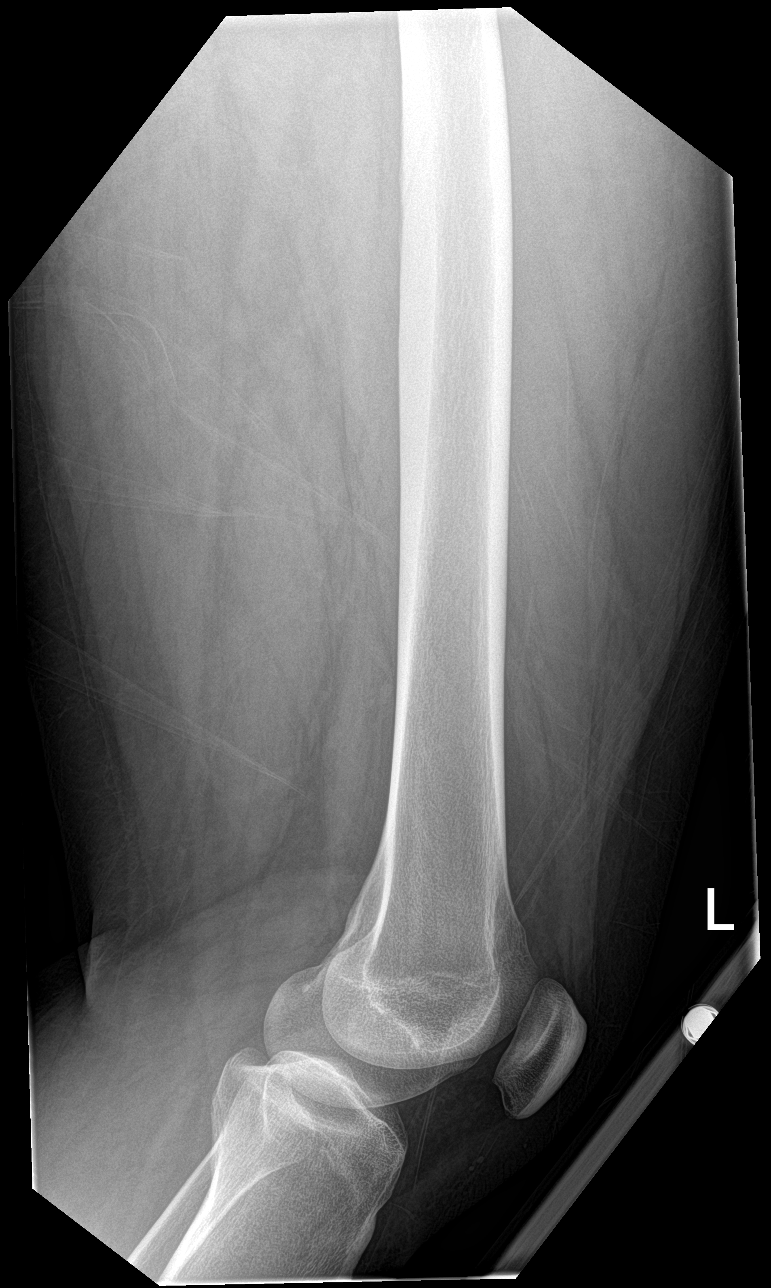
[im 4/4]
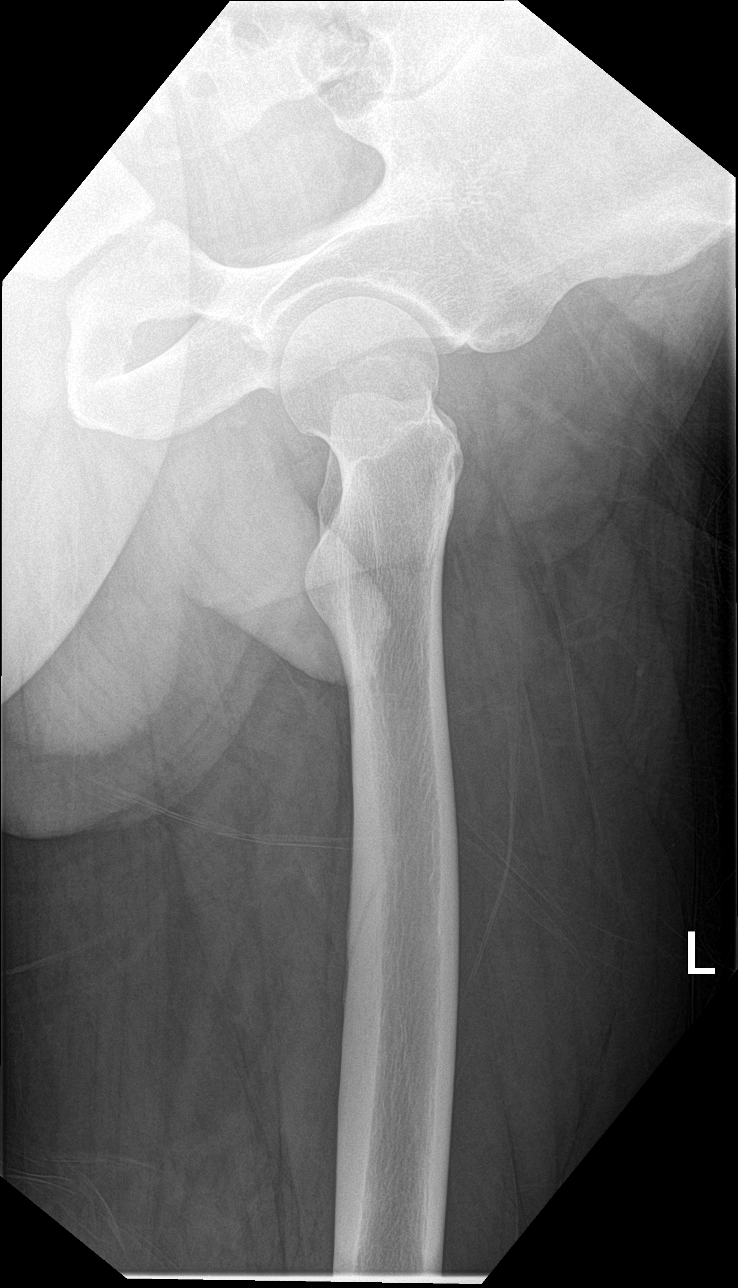

[4 of 4 positions shown; findings below may reference images not displayed]

FINDINGS: The left femur appears grossly intact. There is no evidence of
fracture or dislocation. The left femoral head remains seated at the
acetabulum. The knee joint is grossly unremarkable. No knee joint
effusion is identified.

No significant soft tissue abnormalities are characterized on
radiograph.
IMPRESSION: No evidence of fracture or dislocation.

## 2017-10-13 ENCOUNTER — Encounter (HOSPITAL_COMMUNITY): Payer: Self-pay | Admitting: Emergency Medicine

## 2017-10-13 ENCOUNTER — Ambulatory Visit (HOSPITAL_COMMUNITY)
Admission: EM | Admit: 2017-10-13 | Discharge: 2017-10-13 | Disposition: A | Discharge status: Home / Self Care | Payer: Self-pay | Attending: Internal Medicine | Admitting: Internal Medicine

## 2017-10-13 ENCOUNTER — Other Ambulatory Visit: Payer: Self-pay

## 2017-10-13 DIAGNOSIS — Z7689 Persons encountering health services in other specified circumstances: Secondary | ICD-10-CM

## 2017-10-13 DIAGNOSIS — S40022A Contusion of left upper arm, initial encounter: Secondary | ICD-10-CM

## 2017-10-13 DIAGNOSIS — Z0289 Encounter for other administrative examinations: Secondary | ICD-10-CM
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
## 2017-10-13 NOTE — Discharge Instructions (Signed)
May apply ice to bruise as needed, ibuprofen as needed. May follow up with occupational health clinic if further restrictions or work follow up is needed.
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
## 2019-07-12 IMAGING — DX DG CHEST 2V
2 series · 2 of 2 positions shown · non-contrast
Comparison: Chest x-ray 05/29/2013.

CLINICAL DATA: 26-year-old male with history of recent onset of
left-sided chest pain and anxiety.

EXAM:
CHEST  2 VIEW

[w chest lat]
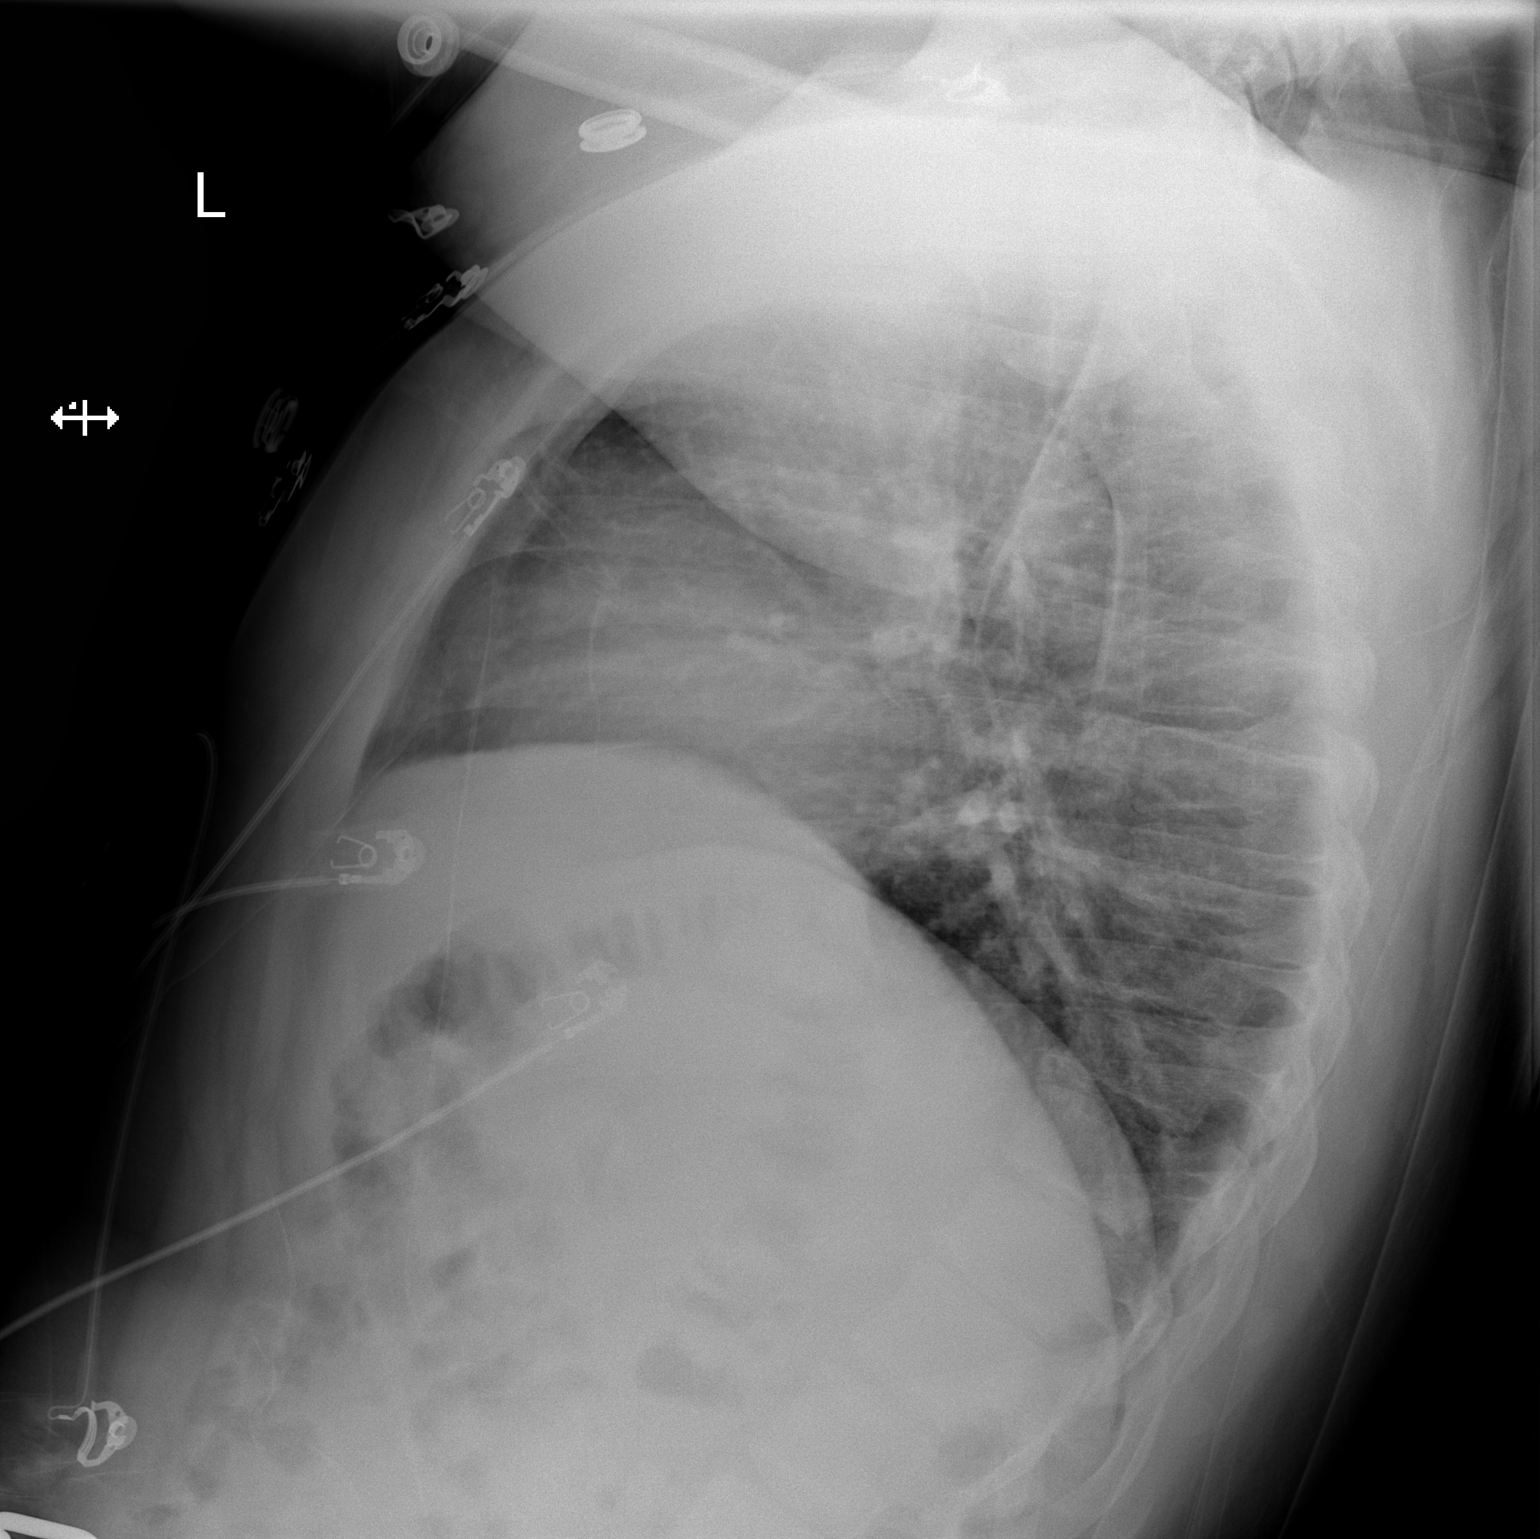

[x chest ap]
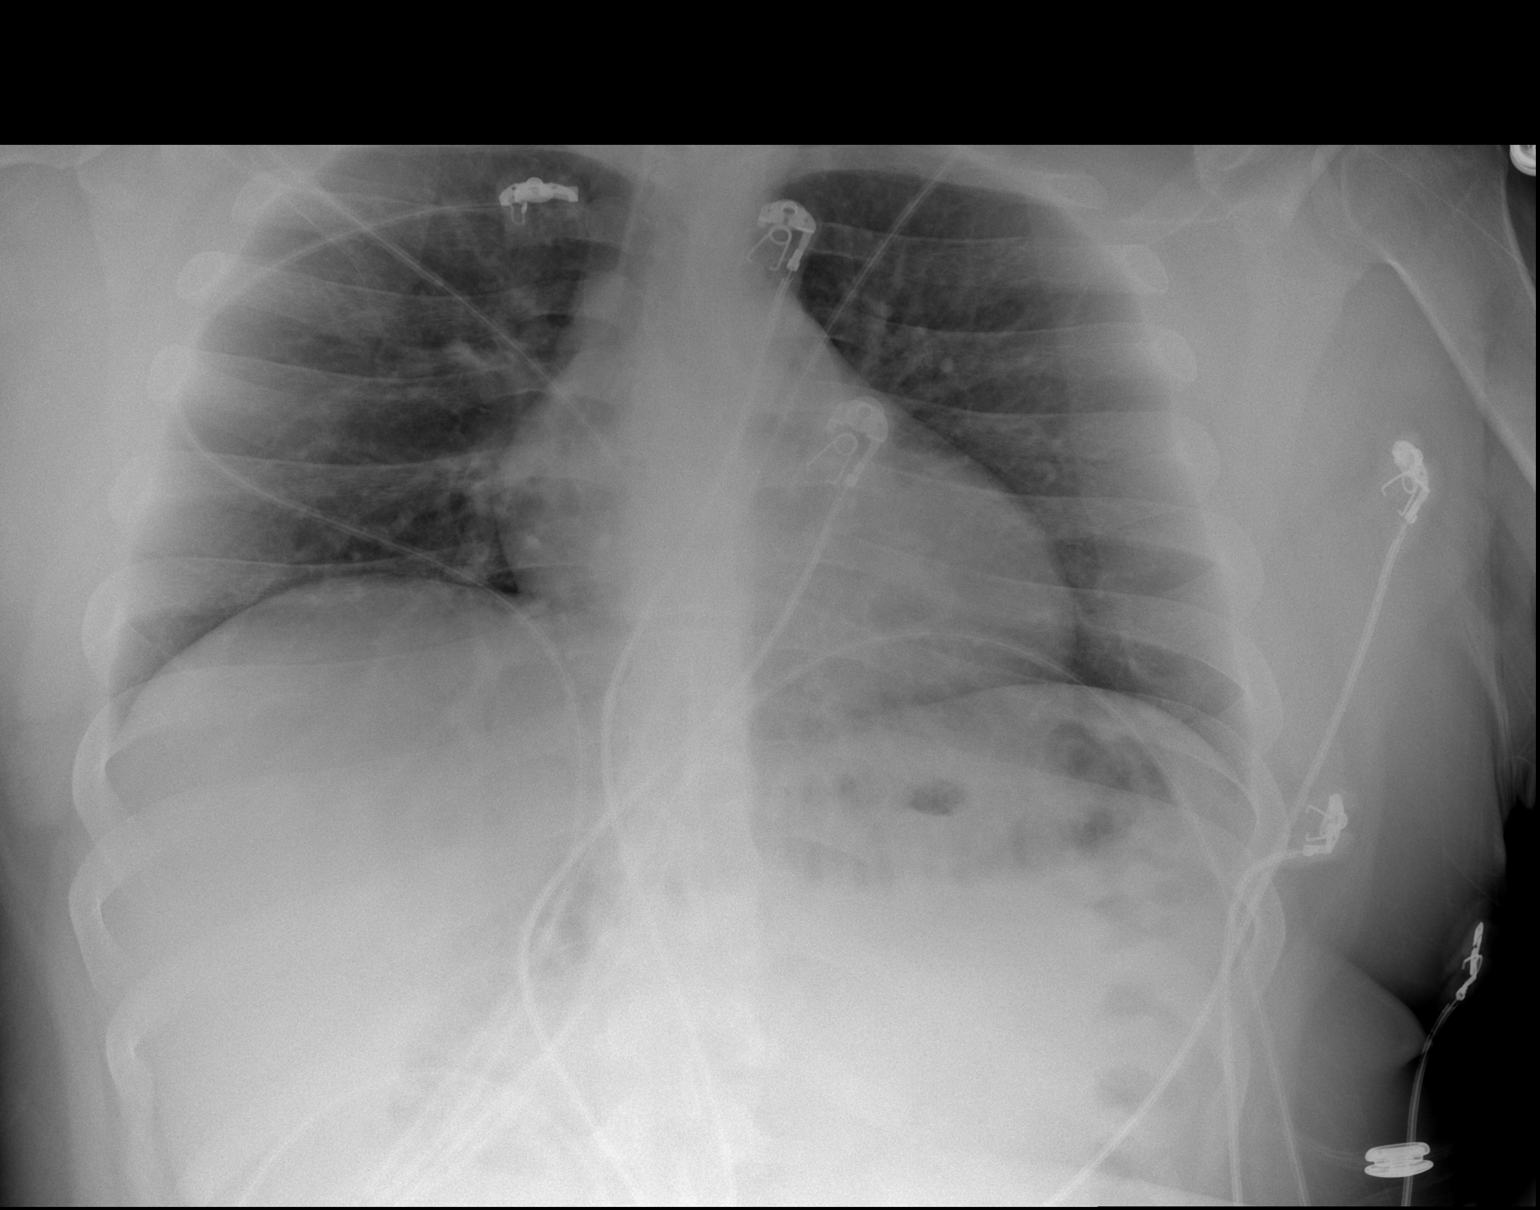

[2 of 2 positions shown; findings below may reference images not displayed]

FINDINGS: Lung volumes are normal. No consolidative airspace disease. No
pleural effusions. No pneumothorax. No pulmonary nodule or mass
noted. Pulmonary vasculature and the cardiomediastinal silhouette
are within normal limits.
IMPRESSION: No radiographic evidence of acute cardiopulmonary disease.
